# Patient Record
Sex: Male | Born: 1955 | Race: Black or African American | Hispanic: No | Marital: Single
Health system: Southern US, Community
[De-identification: ages and names within clinical notes are randomized; demographics above are authoritative.]

---

## 2019-03-10 ENCOUNTER — Emergency Department
Admission: EM | Admit: 2019-03-10 | Discharge: 2019-03-10 | Disposition: A | Discharge status: Home / Self Care | Payer: Self-pay | Attending: Emergency Medicine | Admitting: Emergency Medicine

## 2019-03-10 ENCOUNTER — Emergency Department: Payer: Self-pay

## 2019-03-10 ENCOUNTER — Encounter: Payer: Self-pay | Admitting: Emergency Medicine

## 2019-03-10 ENCOUNTER — Other Ambulatory Visit: Payer: Self-pay

## 2019-03-10 DIAGNOSIS — R609 Edema, unspecified: Secondary | ICD-10-CM

## 2019-03-10 DIAGNOSIS — L03116 Cellulitis of left lower limb: Secondary | ICD-10-CM | POA: Insufficient documentation

## 2019-03-10 DIAGNOSIS — R2242 Localized swelling, mass and lump, left lower limb: Secondary | ICD-10-CM | POA: Insufficient documentation

## 2019-03-10 LAB — CBC WITH DIFFERENTIAL/PLATELET
Abs Immature Granulocytes: 0.04 10*3/uL (ref 0.00–0.07)
Basophils Absolute: 0.1 10*3/uL (ref 0.0–0.1)
Basophils Relative: 1 %
Eosinophils Absolute: 0.2 10*3/uL (ref 0.0–0.5)
Eosinophils Relative: 2 %
HCT: 44.1 % (ref 39.0–52.0)
Hemoglobin: 13.6 g/dL (ref 13.0–17.0)
Immature Granulocytes: 0 %
Lymphocytes Relative: 24 %
Lymphs Abs: 2.6 10*3/uL (ref 0.7–4.0)
MCH: 27.1 pg (ref 26.0–34.0)
MCHC: 30.8 g/dL (ref 30.0–36.0)
MCV: 88 fL (ref 80.0–100.0)
Monocytes Absolute: 0.8 10*3/uL (ref 0.1–1.0)
Monocytes Relative: 7 %
Neutro Abs: 7.3 10*3/uL (ref 1.7–7.7)
Neutrophils Relative %: 66 %
Platelets: 300 10*3/uL (ref 150–400)
RBC: 5.01 MIL/uL (ref 4.22–5.81)
RDW: 12.8 % (ref 11.5–15.5)
WBC: 11 10*3/uL — ABNORMAL HIGH (ref 4.0–10.5)
nRBC: 0 % (ref 0.0–0.2)

## 2019-03-10 LAB — COMPREHENSIVE METABOLIC PANEL
ALT: 18 U/L (ref 0–44)
AST: 17 U/L (ref 15–41)
Albumin: 4 g/dL (ref 3.5–5.0)
Alkaline Phosphatase: 85 U/L (ref 38–126)
Anion gap: 10 (ref 5–15)
BUN: 13 mg/dL (ref 8–23)
CO2: 25 mmol/L (ref 22–32)
Calcium: 9.2 mg/dL (ref 8.9–10.3)
Chloride: 104 mmol/L (ref 98–111)
Creatinine, Ser: 0.89 mg/dL (ref 0.61–1.24)
GFR calc Af Amer: >60 mL/min (ref >60)
GFR calc non Af Amer: >60 mL/min (ref >60)
Glucose, Bld: 106 mg/dL — ABNORMAL HIGH (ref 70–99)
Potassium: 4.3 mmol/L (ref 3.5–5.1)
Sodium: 139 mmol/L (ref 135–145)
Total Bilirubin: 0.6 mg/dL (ref 0.3–1.2)
Total Protein: 7.1 g/dL (ref 6.5–8.1)

## 2019-03-10 MED ORDER — MEDICAL COMPRESSION STOCKINGS MISC: 0 refills | Status: AC

## 2019-03-10 MED ORDER — CEPHALEXIN 500 MG PO CAPS: 500.0000 mg | ORAL_CAPSULE | Freq: Three times a day (TID) | ORAL | 0 refills | Status: AC

## 2019-03-10 NOTE — ED Notes (Signed)
Family did not answer call. Pt reporting he would prefer to wait in the parking lot. Pt in NAD at this time.

## 2019-03-10 NOTE — ED Provider Notes (Signed)
[facility] Emergency Department Provider Note  Time seen: 3:05 PM  I have reviewed the triage vital signs and the nursing notes.   HISTORY  Chief Complaint Foot Pain   HPI John Suarez is a 63 y.o. male presents to the emergency department for left foot pain and swelling.   According to the patient over the past 5 or 6 days he has noted pain and swelling to his left leg.  Patient states last week he drove approximately 16 hours in the car from [place] to [place].  No history of blood clots previously.  Denies any fever denies any cough congestion or shortness of breath.  No chest pain.  History reviewed. No pertinent past medical history.  There are no active problems to display for this patient.   History reviewed. No pertinent surgical history.  Prior to Admission medications   Not on File    Not on File  No family history on file.  Social History Social History   Tobacco Use  . Smoking status: Not on file  Substance Use Topics  . Alcohol use: Not on file  . Drug use: Not on file    Review of Systems Constitutional: Negative for fever Cardiovascular: Negative for chest pain. Respiratory: Negative for shortness of breath. Gastrointestinal: Negative for abdominal pain, Genitourinary: Negative for urinary compaints Musculoskeletal: Left leg pain and swelling. Skin: Negative for skin complaints  Neurological: Negative for headache All other ROS negative  ____________________________________________   PHYSICAL EXAM:  VITAL SIGNS: ED Triage Vitals  Enc Vitals Group     BP 03/10/19 1013 (!) 158/86     Pulse Rate 03/10/19 1013 83     Resp 03/10/19 1013 18     Temp 03/10/19 1013 98.4 F (36.9 C)     Temp Source 03/10/19 1013 Oral     SpO2 03/10/19 1013 99 %     Weight 03/10/19 0947 206 lb (93.4 kg)     Height 03/10/19 0947 6\' 1"  (1.854 m)     Head Circumference --      Peak Flow --      Pain Score 03/10/19 0947 10     Pain Loc  --      Pain Edu? --      Excl. in GC? --    Constitutional: Alert and oriented. Well appearing and in no distress. Eyes: Normal exam ENT      Head: Normocephalic and atraumatic.      Mouth/Throat: Mucous membranes are moist. Cardiovascular: Normal rate, regular rhythm. Respiratory: Normal respiratory effort without tachypnea nor retractions. Breath sounds are clear Gastrointestinal: Soft and nontender. No distention.  Musculoskeletal: Nontender with normal range of motion in all extremities.  1+ edema in the left lower extremity with mild tenderness of the left foot.  Patient does have a small ulceration to the second toe. Neurologic:  Normal speech and language. No gross focal neurologic deficits Skin:  Skin is warm Psychiatric: Mood and affect are normal.   ____________________________________________     RADIOLOGY Ultrasound negative for DVT  ____________________________________________   INITIAL IMPRESSION / ASSESSMENT AND PLAN / ED COURSE  Pertinent labs & imaging results that were available during my care of the patient were reviewed by me and considered in my medical decision making (see chart for details).   Patient presents to the emergency department for left lower extremity edema and tenderness over the past 5 days.  No obvious redness although there is a possible nidus of infection  in the second toe.  Differential this time would include cellulitis versus DVT.  Patient did have a recent 16-hour car drive which would make DVT more likely.  Patient's lab work is largely reassuring including normal renal function.  No chest pain or shortness of breath.  Ultrasound is negative for DVT.  We will cover with antibiotics for possible cellulitis given slight white blood cell count elevation and clinical findings.  We will order a compression stocking for the patient as well.  Patient will follow up with his doctor for recheck/reevaluation.  I discussed return precautions.  John Suarez was evaluated in Emergency Department on 03/10/2019 for the symptoms described in the history of present illness. He was evaluated in the context of the global COVID-19 pandemic, which necessitated consideration that the patient might be at risk for infection with the SARS-CoV-2 virus that causes COVID-19. Institutional protocols and algorithms that pertain to the evaluation of patients at risk for COVID-19 are in a state of rapid change based on information released by regulatory bodies including the CDC and federal and state organizations. These policies and algorithms were followed during the patient's care in the ED.  ____________________________________________   FINAL CLINICAL IMPRESSION(S) / ED DIAGNOSES  Peripheral edema Cellulitis   John Dark, MD 03/10/19 1511

## 2019-03-10 NOTE — ED Notes (Signed)
Pt resting in bed. NAD noted at this time.

## 2019-03-10 NOTE — Discharge Instructions (Addendum)
As we discussed please use your compression stockings and take your antibiotic as prescribed for his full course.  Please follow-up with your doctor in the next 2 to 3 days for recheck/reevaluation.  Return to the emergency department for any increased swelling, pain, chest pain or any trouble breathing.

## 2019-03-10 NOTE — ED Notes (Signed)
Pt calling family in lobby

## 2019-03-10 NOTE — ED Notes (Signed)
Patient transported to Ultrasound 

## 2019-03-10 NOTE — ED Triage Notes (Signed)
Pt reports for the past 5 days has had pain and swelling to his left lower leg, ankle and foot. Pt denies injuries.

## 2019-03-10 NOTE — ED Notes (Signed)
Warm blanket applied

## 2020-05-15 IMAGING — US US EXTREM LOW VENOUS*L*
1 series · 13 of 24 positions shown · non-contrast
Comparison: None.

CLINICAL DATA: Left lower extremity pain and edema. History of
gout. Evaluate for DVT.



[Series 1: us extrem low venous*left* · 13 of 33 slices shown]
[im 1/33]
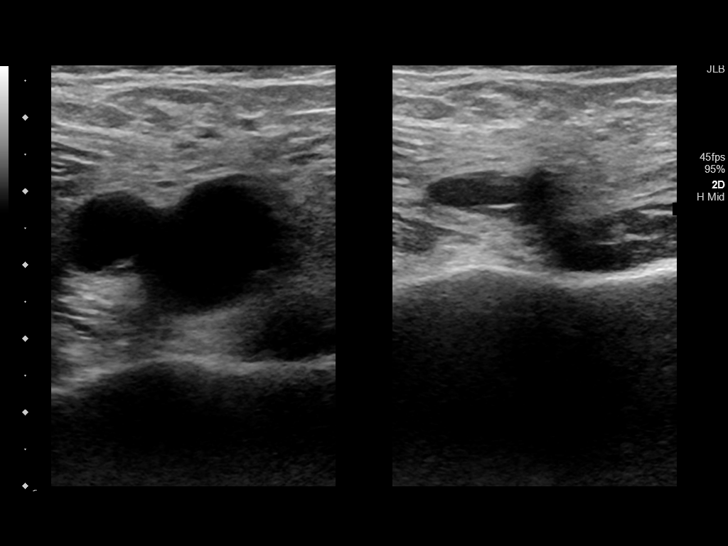
[im 3/33]
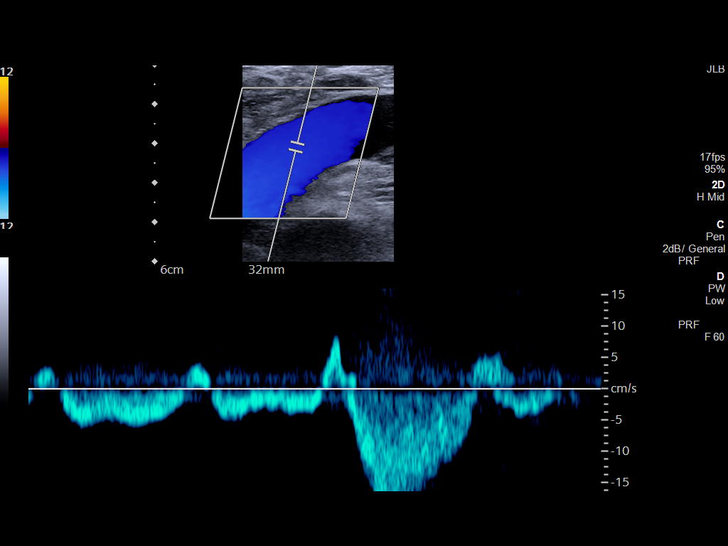
[im 6/33]
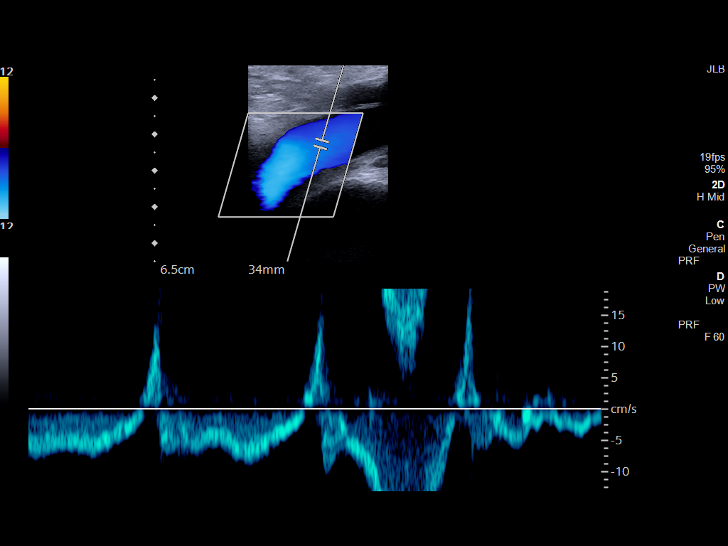
[im 9/33]
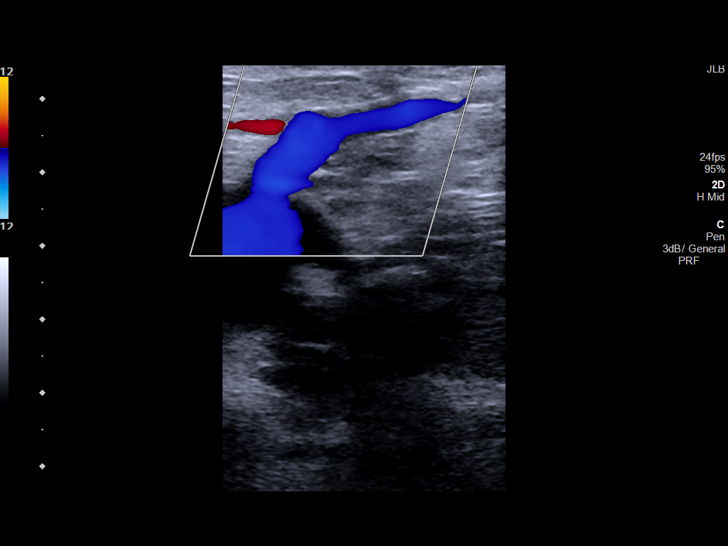
[im 12/33]
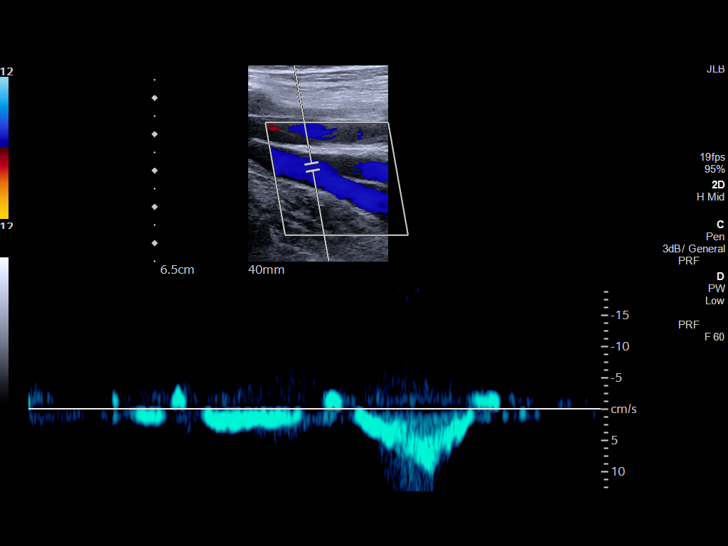
[im 14/33]
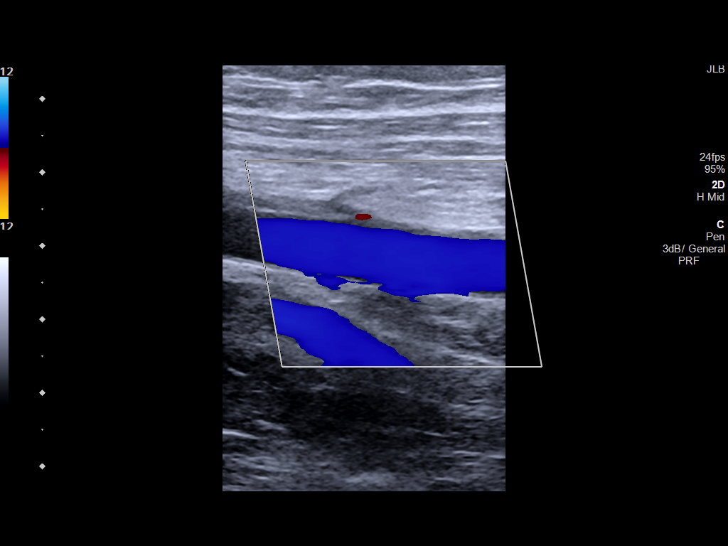
[im 17/33]
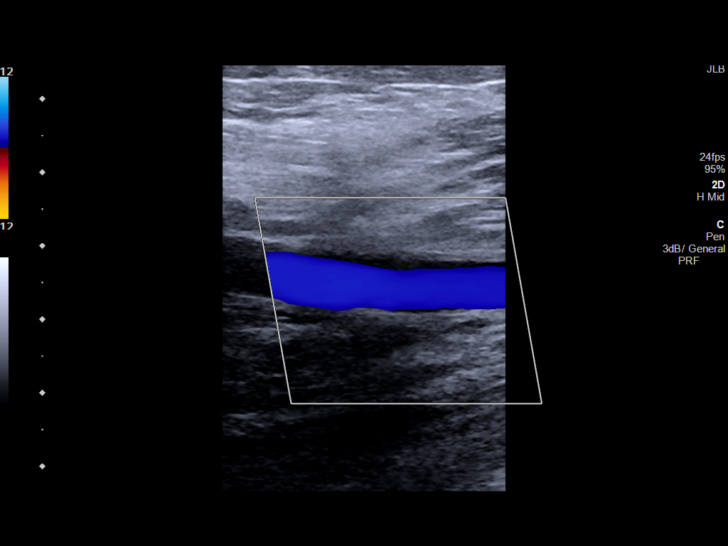
[im 19/33]
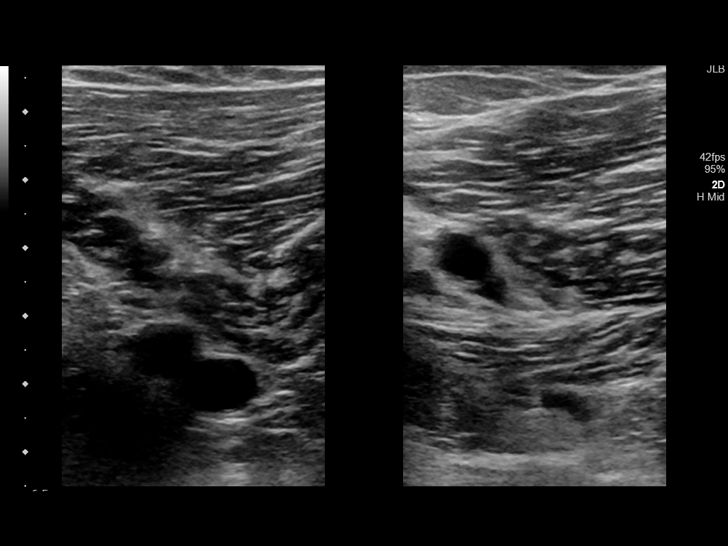
[im 21/33]
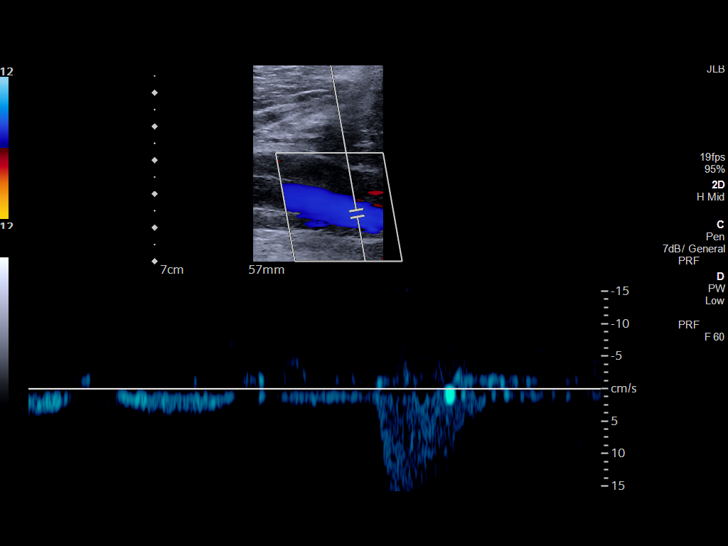
[im 24/33]
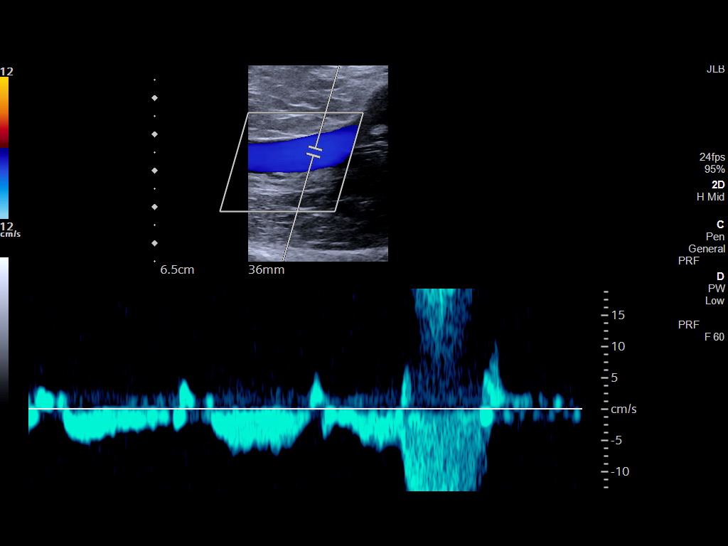
[im 27/33]
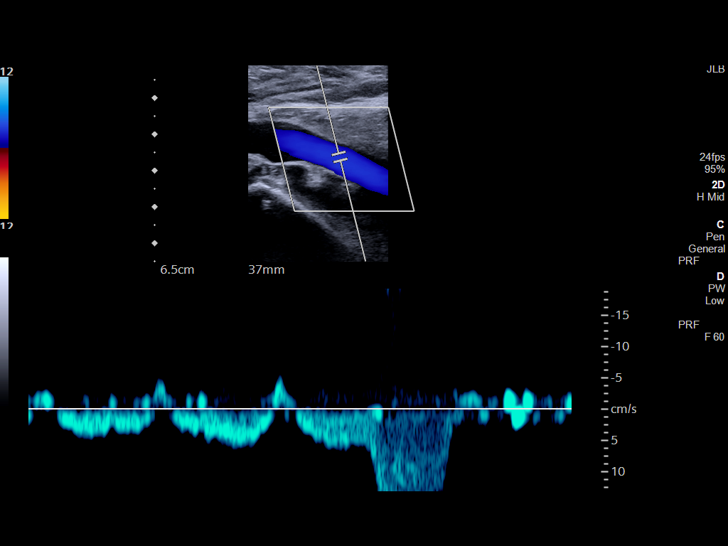
[im 30/33]
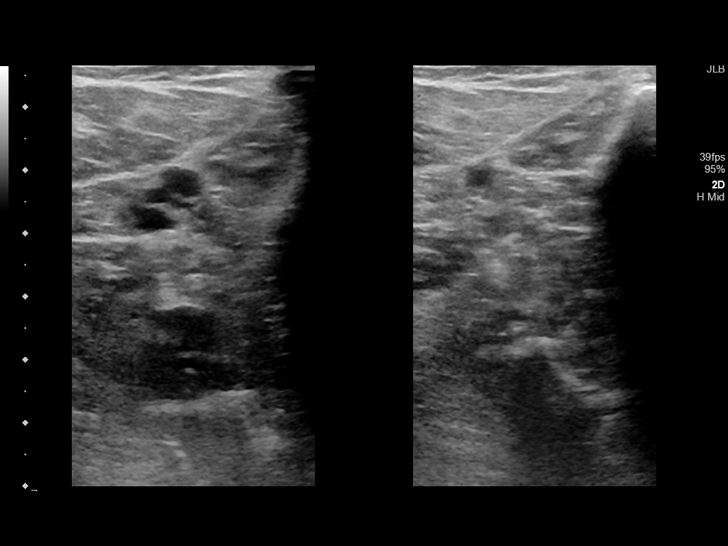
[im 33/33]
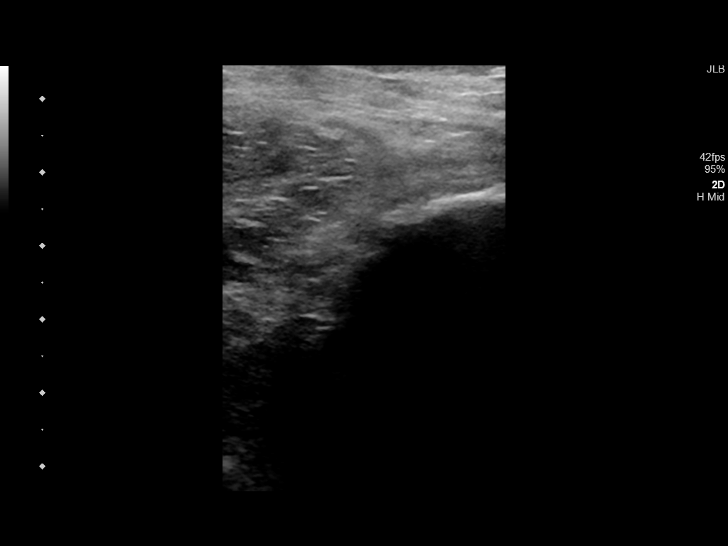

[13 of 24 positions shown; findings below may reference images not displayed]

FINDINGS: Contralateral Common Femoral Vein: Respiratory phasicity is normal
and symmetric with the symptomatic side. No evidence of thrombus.
Normal compressibility.

Common Femoral Vein: No evidence of thrombus. Normal
compressibility, respiratory phasicity and response to augmentation.

Saphenofemoral Junction: No evidence of thrombus. Normal
compressibility and flow on color Doppler imaging.

Profunda Femoral Vein: No evidence of thrombus. Normal
compressibility and flow on color Doppler imaging.

Femoral Vein: No evidence of thrombus. Normal compressibility,
respiratory phasicity and response to augmentation.

Popliteal Vein: No evidence of thrombus. Normal compressibility,
respiratory phasicity and response to augmentation.

Calf Veins: No evidence of thrombus. Normal compressibility and flow
on color Doppler imaging.

Superficial Great Saphenous Vein: No evidence of thrombus. Normal
compressibility.

Venous Reflux:  None.

Other Findings:  None.
IMPRESSION: No evidence of DVT within the left lower extremity.

## 2022-01-27 DIAGNOSIS — S8992XA Unspecified injury of left lower leg, initial encounter: Secondary | ICD-10-CM | POA: Diagnosis not present

## 2022-01-27 DIAGNOSIS — F17210 Nicotine dependence, cigarettes, uncomplicated: Secondary | ICD-10-CM | POA: Diagnosis not present

## 2022-01-27 DIAGNOSIS — W298XXA Contact with other powered powered hand tools and household machinery, initial encounter: Secondary | ICD-10-CM | POA: Diagnosis not present

## 2022-01-27 DIAGNOSIS — S81002A Unspecified open wound, left knee, initial encounter: Secondary | ICD-10-CM | POA: Diagnosis not present

## 2022-01-27 DIAGNOSIS — F1210 Cannabis abuse, uncomplicated: Secondary | ICD-10-CM | POA: Diagnosis not present

## 2022-01-27 DIAGNOSIS — S81022A Laceration with foreign body, left knee, initial encounter: Secondary | ICD-10-CM | POA: Diagnosis not present

## 2022-01-27 DIAGNOSIS — W3183XA Contact with special construction vehicle in stationary use, initial encounter: Secondary | ICD-10-CM | POA: Diagnosis not present

## 2022-01-27 DIAGNOSIS — M12562 Traumatic arthropathy, left knee: Secondary | ICD-10-CM | POA: Diagnosis not present

## 2022-01-27 DIAGNOSIS — S81012A Laceration without foreign body, left knee, initial encounter: Secondary | ICD-10-CM | POA: Diagnosis not present
